# Patient Record
Sex: Male | Born: 1980 | Race: White | Hispanic: No | Marital: Married | Smoking: Current every day smoker
Health system: Southern US, Community
[De-identification: ages and names within clinical notes are randomized; demographics above are authoritative.]

## PROBLEM LIST (undated history)

## (undated) HISTORY — PX: HERNIA REPAIR: SHX51

---

## 2016-08-21 ENCOUNTER — Encounter (HOSPITAL_COMMUNITY): Payer: Self-pay

## 2016-08-21 ENCOUNTER — Emergency Department (HOSPITAL_COMMUNITY)
Admission: EM | Admit: 2016-08-21 | Discharge: 2016-08-21 | Disposition: A | Discharge status: Home / Self Care | Payer: Self-pay | Attending: Emergency Medicine | Admitting: Emergency Medicine

## 2016-08-21 ENCOUNTER — Emergency Department (HOSPITAL_COMMUNITY): Payer: Self-pay

## 2016-08-21 DIAGNOSIS — F17210 Nicotine dependence, cigarettes, uncomplicated: Secondary | ICD-10-CM | POA: Insufficient documentation

## 2016-08-21 DIAGNOSIS — R531 Weakness: Secondary | ICD-10-CM | POA: Insufficient documentation

## 2016-08-21 DIAGNOSIS — R0789 Other chest pain: Secondary | ICD-10-CM | POA: Insufficient documentation

## 2016-08-21 DIAGNOSIS — R200 Anesthesia of skin: Secondary | ICD-10-CM | POA: Insufficient documentation

## 2016-08-21 DIAGNOSIS — R519 Headache, unspecified: Secondary | ICD-10-CM

## 2016-08-21 DIAGNOSIS — I639 Cerebral infarction, unspecified: Secondary | ICD-10-CM

## 2016-08-21 DIAGNOSIS — R51 Headache: Secondary | ICD-10-CM | POA: Insufficient documentation

## 2016-08-21 DIAGNOSIS — H538 Other visual disturbances: Secondary | ICD-10-CM | POA: Insufficient documentation

## 2016-08-21 DIAGNOSIS — R202 Paresthesia of skin: Secondary | ICD-10-CM

## 2016-08-21 LAB — I-STAT CHEM 8, ED
BUN: 6 mg/dL (ref 6–20)
Calcium, Ion: 1.16 mmol/L (ref 1.15–1.40)
Chloride: 105 mmol/L (ref 101–111)
Creatinine, Ser: 0.8 mg/dL (ref 0.61–1.24)
Glucose, Bld: 97 mg/dL (ref 65–99)
HCT: 46 % (ref 39.0–52.0)
Hemoglobin: 15.6 g/dL (ref 13.0–17.0)
Potassium: 4.3 mmol/L (ref 3.5–5.1)
Sodium: 139 mmol/L (ref 135–145)
TCO2: 25 mmol/L (ref 0–100)

## 2016-08-21 LAB — DIFFERENTIAL
BASOS ABS: 0 10*3/uL (ref 0.0–0.1)
Basophils Relative: 1 %
EOS ABS: 0.2 10*3/uL (ref 0.0–0.7)
Eosinophils Relative: 3 %
LYMPHS ABS: 1.7 10*3/uL (ref 0.7–4.0)
Lymphocytes Relative: 33 %
MONOS PCT: 9 %
Monocytes Absolute: 0.5 10*3/uL (ref 0.1–1.0)
NEUTROS ABS: 2.9 10*3/uL (ref 1.7–7.7)
NEUTROS PCT: 54 %

## 2016-08-21 LAB — COMPREHENSIVE METABOLIC PANEL
ALT: 19 U/L (ref 17–63)
AST: 23 U/L (ref 15–41)
Albumin: 4.3 g/dL (ref 3.5–5.0)
Alkaline Phosphatase: 58 U/L (ref 38–126)
Anion gap: 7 (ref 5–15)
BUN: 6 mg/dL (ref 6–20)
CO2: 24 mmol/L (ref 22–32)
Calcium: 9.2 mg/dL (ref 8.9–10.3)
Chloride: 107 mmol/L (ref 101–111)
Creatinine, Ser: 0.85 mg/dL (ref 0.61–1.24)
GFR calc Af Amer: >60 mL/min (ref >60)
GFR calc non Af Amer: >60 mL/min (ref >60)
Glucose, Bld: 97 mg/dL (ref 65–99)
Potassium: 4.4 mmol/L (ref 3.5–5.1)
Sodium: 138 mmol/L (ref 135–145)
Total Bilirubin: 0.6 mg/dL (ref 0.3–1.2)
Total Protein: 7.2 g/dL (ref 6.5–8.1)

## 2016-08-21 LAB — I-STAT TROPONIN, ED: Troponin i, poc: 0 ng/mL (ref 0.00–0.08)

## 2016-08-21 LAB — APTT: aPTT: 28 seconds (ref 24–36)

## 2016-08-21 LAB — PROTIME-INR
INR: 0.9
Prothrombin Time: 12.2 seconds (ref 11.4–15.2)

## 2016-08-21 MED ORDER — LORAZEPAM 2 MG/ML IJ SOLN
0.5000 mg | Freq: Once | INTRAMUSCULAR | Status: AC
  Administered 2016-08-21: 0.5 mg via INTRAVENOUS
  Filled 2016-08-21: qty 1

## 2016-08-21 MED ORDER — KETOROLAC TROMETHAMINE 15 MG/ML IJ SOLN
15.0000 mg | Freq: Once | INTRAMUSCULAR | Status: AC
  Administered 2016-08-21: 15 mg via INTRAVENOUS
  Filled 2016-08-21: qty 1

## 2016-08-21 MED ORDER — DIPHENHYDRAMINE HCL 50 MG/ML IJ SOLN
12.5000 mg | Freq: Once | INTRAMUSCULAR | Status: AC
  Administered 2016-08-21: 12.5 mg via INTRAVENOUS
  Filled 2016-08-21: qty 1

## 2016-08-21 MED ORDER — METOCLOPRAMIDE HCL 5 MG/ML IJ SOLN
10.0000 mg | Freq: Once | INTRAMUSCULAR | Status: AC
  Administered 2016-08-21: 10 mg via INTRAVENOUS
  Filled 2016-08-21: qty 2
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
## 2016-08-21 NOTE — ED Notes (Addendum)
Patient transported to MRI 

## 2016-08-21 NOTE — ED Triage Notes (Signed)
Per Pt, Pt is coming from work with complaints of bilateral anterior head pressure that started this morning that started while he was driving. The pain increased and patient reports feeling "detached" and having trouble getting his words out. Pt does not have slurred speech, but some delay. Pt reports left sided-intermittent chest pain and bilateral arm numbness in the two medial fingers.
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
## 2016-08-21 NOTE — ED Notes (Signed)
Stroke cancelled by neurologist at [phone]
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
## 2016-08-21 NOTE — ED Notes (Signed)
EKG completed in triage and showed to Dr. Juleen ChinaKohut in [facility] A.

## 2018-07-19 IMAGING — CT CT HEAD CODE STROKE
4 series · 16 of 47 positions shown, 18 images · non-contrast
Comparison: None.

CLINICAL DATA: Code stroke. Focal neuro deficit greater than 6
hours, stroke suspected. Headache right-sided numbness

EXAM:
CT HEAD WITHOUT CONTRAST
TECHNIQUE: Contiguous axial images were obtained from the base of the skull
through the vertex without intravenous contrast.

[Series 3: head wo · axial · 0.43mm/px · z∈[+1808,+1934]mm · 7 of 35 slices shown, 9 images]
[im 5/35  brain]
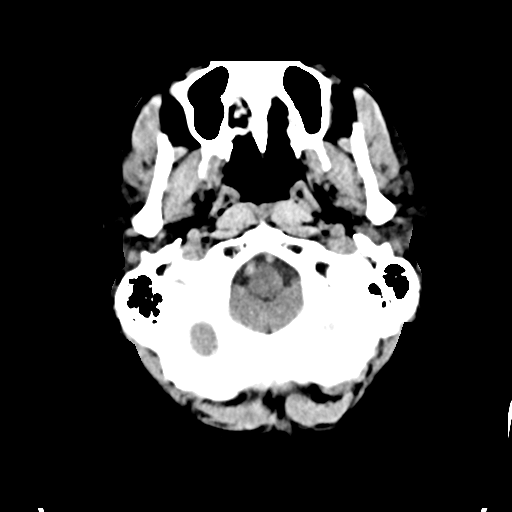
[im 5/35  bone]
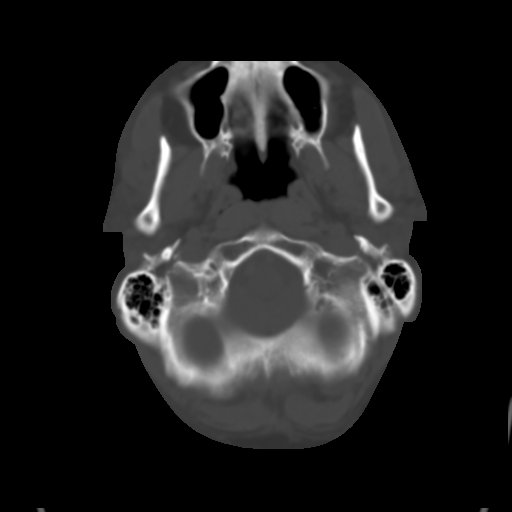
[im 9/35  brain]
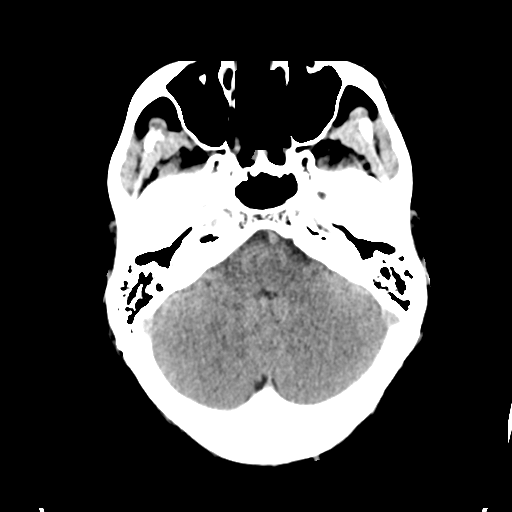
[im 13/35  brain]
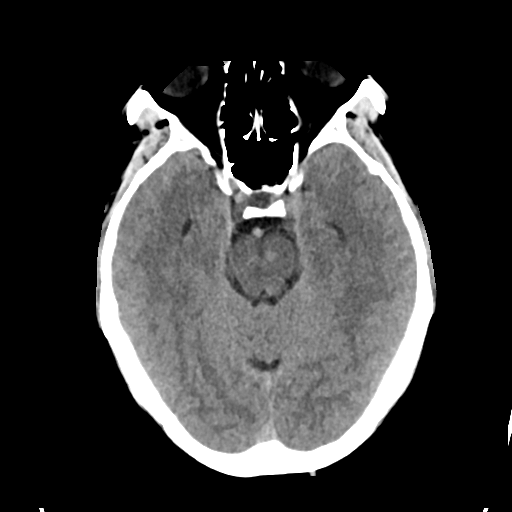
[im 18/35  brain]
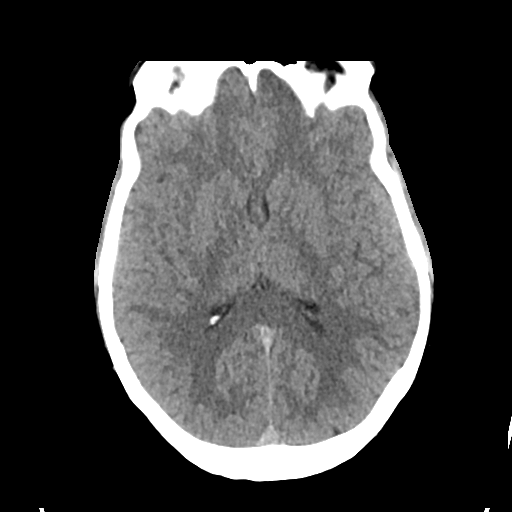
[im 22/35  brain]
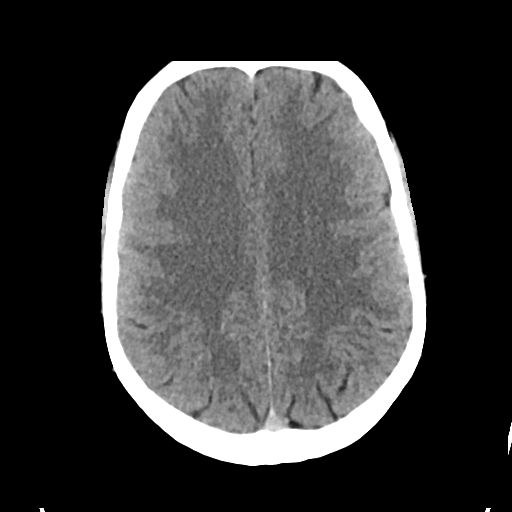
[im 22/35  bone]
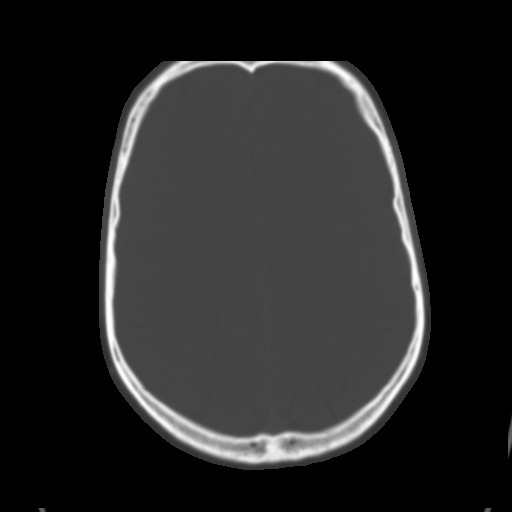
[im 26/35  brain]
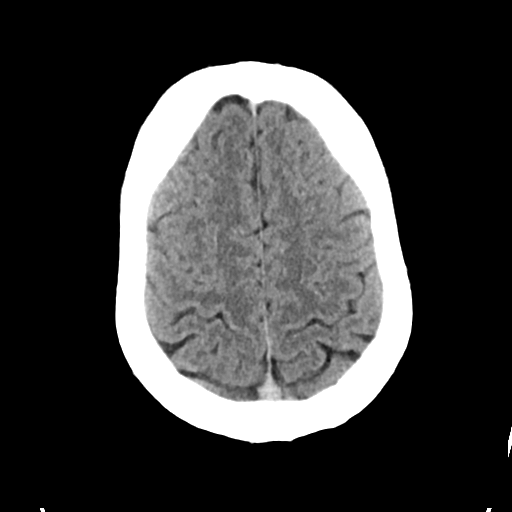
[im 30/35  brain]
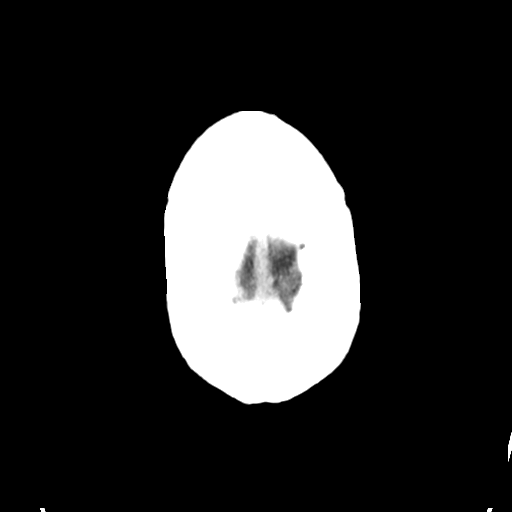

[Series 4: head bone · axial · 0.43mm/px · z∈[+1804,+1838]mm · 3 of 86 slices shown]
[im 9/86  bone]
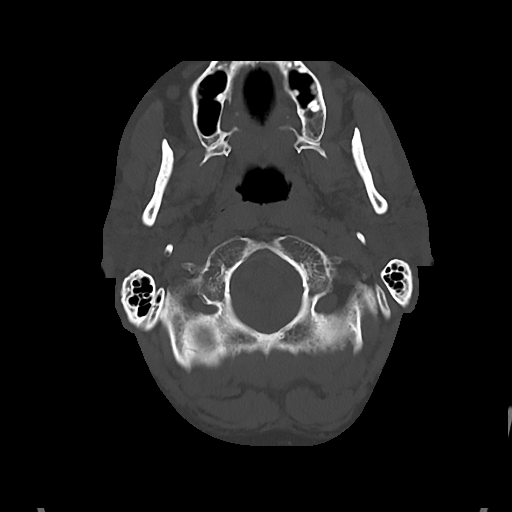
[im 18/86  bone]
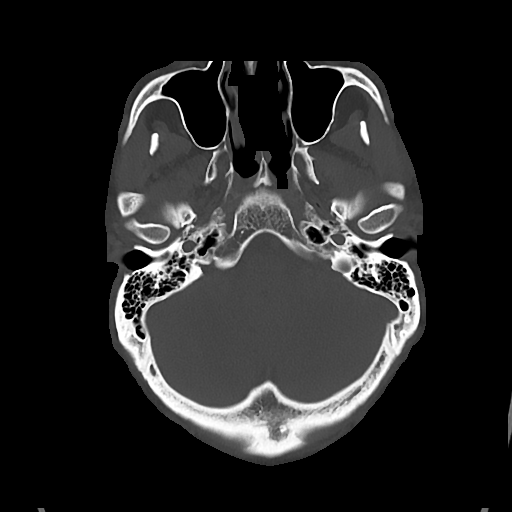
[im 26/86  bone]
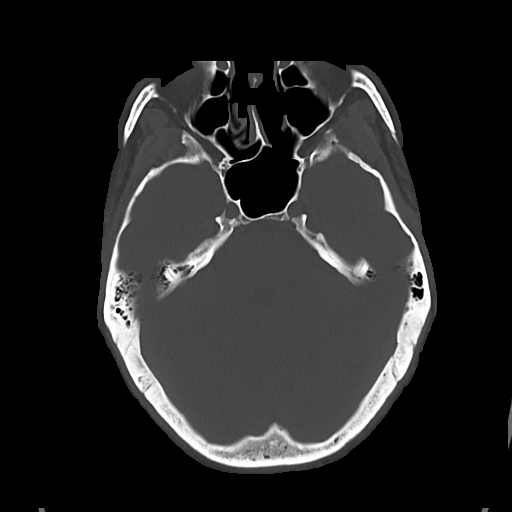

[Series 5: cor soft · coronal · 0.36mm/px · 3 of 74 slices shown]
[im 25/74  brain]
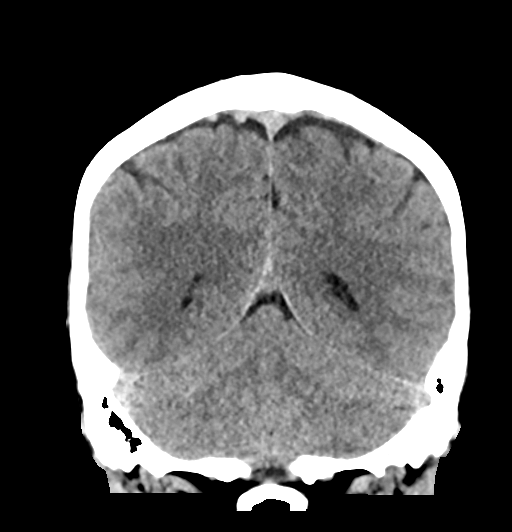
[im 33/74  brain]
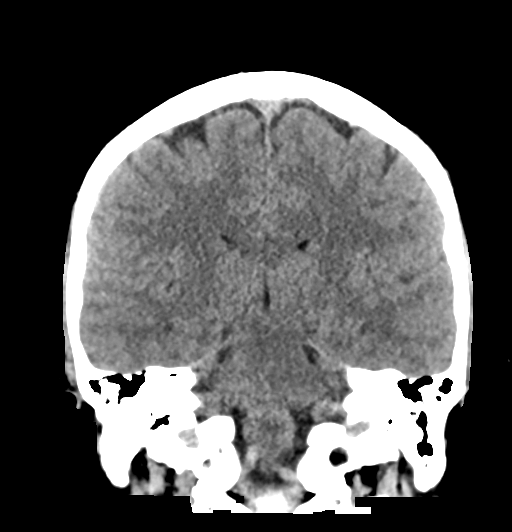
[im 41/74  brain]
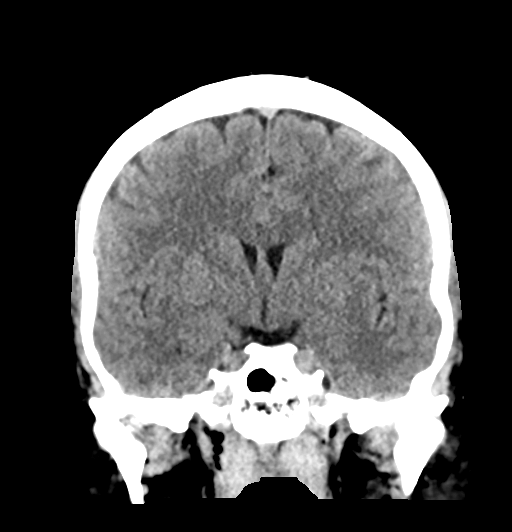

[Series 6: sag soft · sagittal · 0.33mm/px · 3 of 67 slices shown]
[im 23/67  brain]
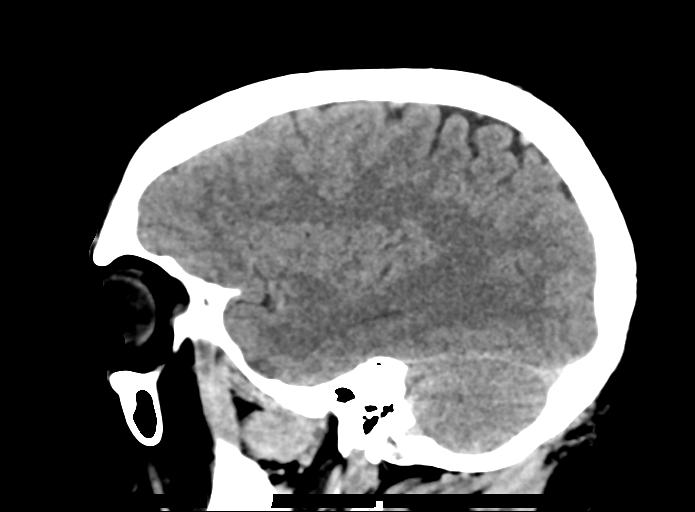
[im 34/67  brain]
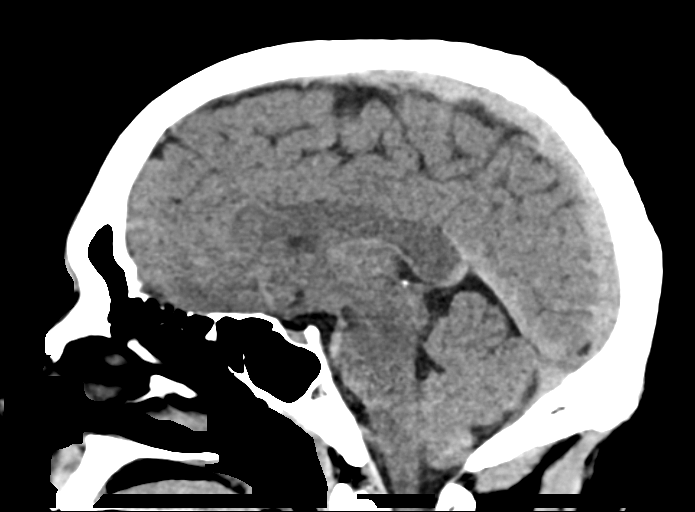
[im 45/67  brain]
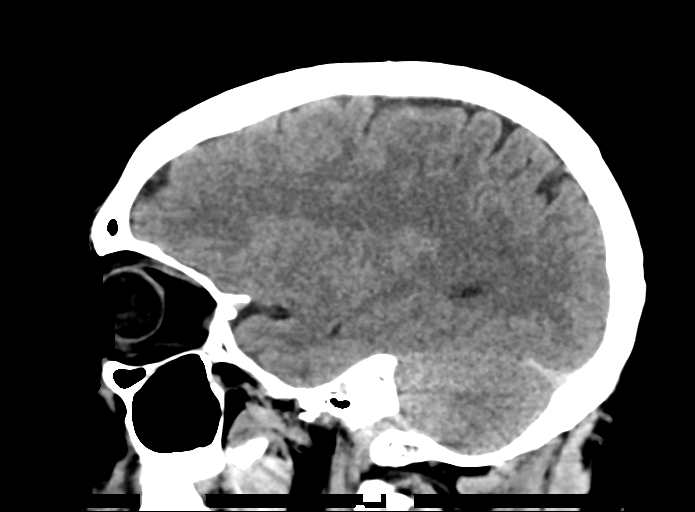

[16 of 47 positions shown; findings below may reference images not displayed]

FINDINGS: Brain: No evidence of acute infarction, hemorrhage, hydrocephalus,
extra-axial collection or mass lesion/mass effect.

Vascular: No hyperdense vessel or unexpected calcification.

Skull: Negative

Sinuses/Orbits: Negative

Other: Non

ASPECTS (Alberta Stroke Program Early CT Score)

- Ganglionic level infarction (caudate, lentiform nuclei, internal
capsule, insula, M1-M3 cortex): 7

- Supraganglionic infarction (M4-M6 cortex): 3

Total score (0-10 with 10 being normal): 10
IMPRESSION: 1. Negative CT Head
2. ASPECTS is 10

These results were called by telephone at the time of interpretation
on 08/21/2016 at [DATE] to Dr. Tiger, who verbally acknowledged
these results.
